# Patient Record
Sex: Female | Born: 1950 | Race: White | Hispanic: No | Marital: Married | Smoking: Former smoker
Health system: Southern US, Community
[De-identification: ages and names within clinical notes are randomized; demographics above are authoritative.]

## PROBLEM LIST (undated history)

## (undated) DIAGNOSIS — C9000 Multiple myeloma not having achieved remission: Secondary | ICD-10-CM

## (undated) DIAGNOSIS — G510 Bell's palsy: Secondary | ICD-10-CM

## (undated) HISTORY — PX: TOTAL SHOULDER REPLACEMENT: SUR1217

## (undated) HISTORY — PX: BUNIONECTOMY: SHX129

## (undated) HISTORY — PX: FOOT SURGERY: SHX648

## (undated) HISTORY — PX: REPLACEMENT TOTAL KNEE BILATERAL: SUR1225

## (undated) HISTORY — PX: SHOULDER SURGERY: SHX246

## (undated) HISTORY — PX: CHOLECYSTECTOMY: SHX55

---

## 2008-03-07 ENCOUNTER — Emergency Department (HOSPITAL_BASED_OUTPATIENT_CLINIC_OR_DEPARTMENT_OTHER): Admission: EM | Admit: 2008-03-07 | Discharge: 2008-03-07 | Payer: Self-pay | Admitting: Emergency Medicine

## 2008-03-18 ENCOUNTER — Encounter: Admission: RE | Admit: 2008-03-18 | Discharge: 2008-03-18 | Payer: Self-pay | Admitting: Orthopedic Surgery

## 2008-03-21 ENCOUNTER — Encounter: Admission: RE | Admit: 2008-03-21 | Discharge: 2008-03-21 | Payer: Self-pay | Admitting: Orthopedic Surgery

## 2008-03-25 ENCOUNTER — Ambulatory Visit (HOSPITAL_COMMUNITY): Admission: RE | Admit: 2008-03-25 | Discharge: 2008-03-25 | Payer: Self-pay | Admitting: Interventional Radiology

## 2008-03-27 ENCOUNTER — Ambulatory Visit (HOSPITAL_COMMUNITY): Admission: RE | Admit: 2008-03-27 | Discharge: 2008-03-27 | Payer: Self-pay | Admitting: Interventional Radiology

## 2008-04-15 ENCOUNTER — Encounter: Payer: Self-pay | Admitting: Interventional Radiology

## 2009-04-26 IMAGING — CT CT L SPINE W/O CM
4 of 6 series · 17 of 34 positions shown, 19 images · non-contrast
Comparison: MRI 03/18/2008

CLINICAL DATA: Low back pain.  Abnormal L2 vertebra.

CT LUMBAR SPINE WITHOUT CONTRAST
TECHNIQUE: Multidetector CT imaging of the lumbar spine was
performed without intravenous contrast administration. Multiplanar
CT image reconstructions were also generated.

[Series 2: l-spine helical · axial · 0.37mm/px · z∈[-358,-201]mm · 5 of 95 slices shown, 7 images]
[im 16/95  soft-tissue]
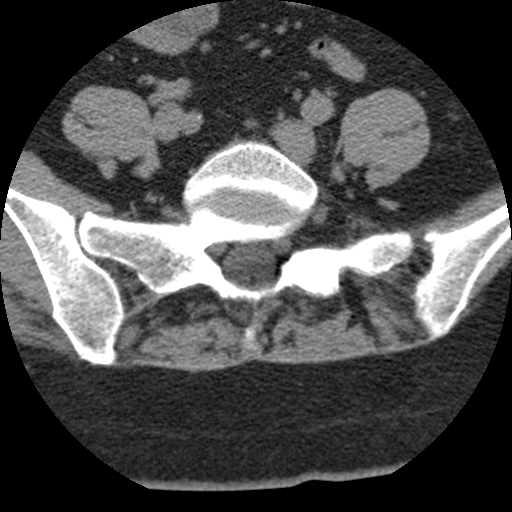
[im 16/95  bone]
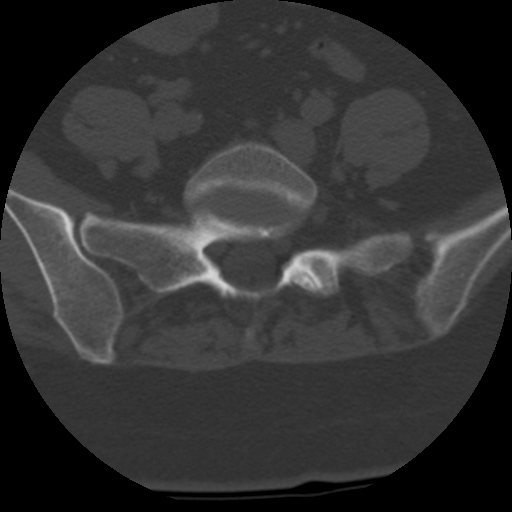
[im 32/95  bone]
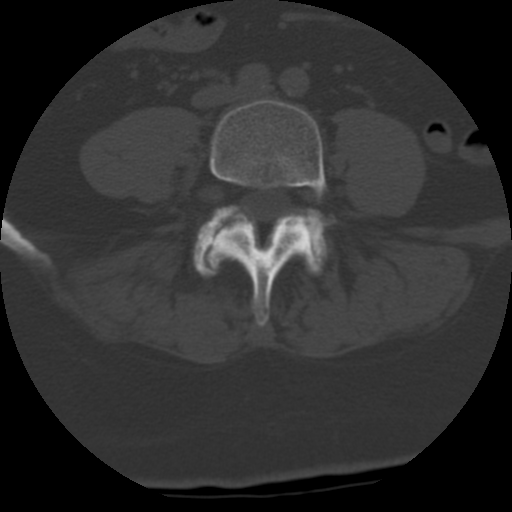
[im 48/95  bone]
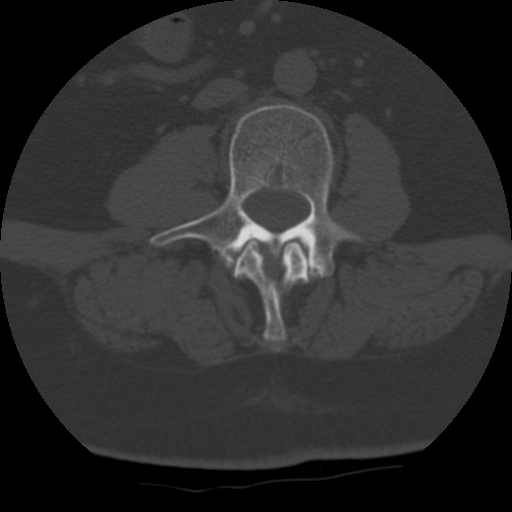
[im 63/95  bone]
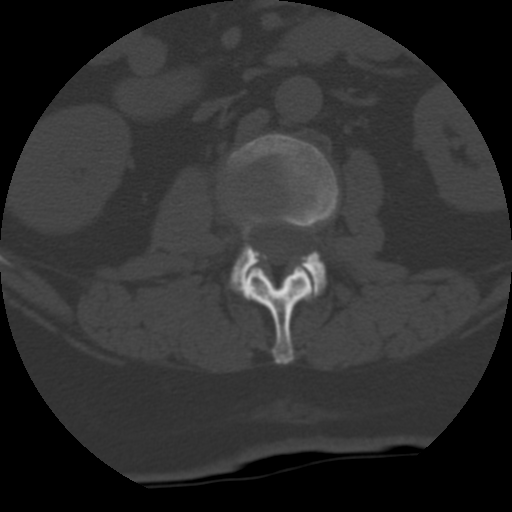
[im 79/95  soft-tissue]
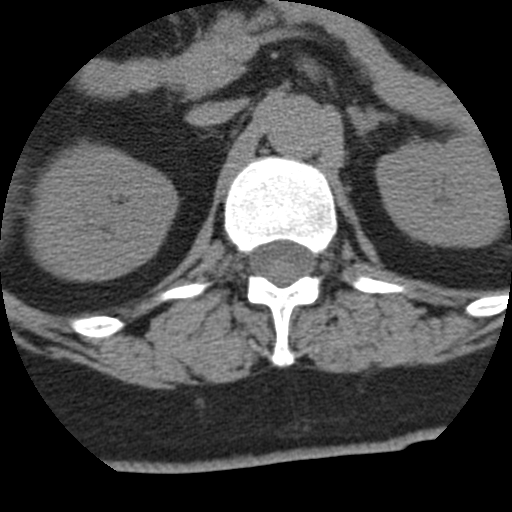
[im 79/95  bone]
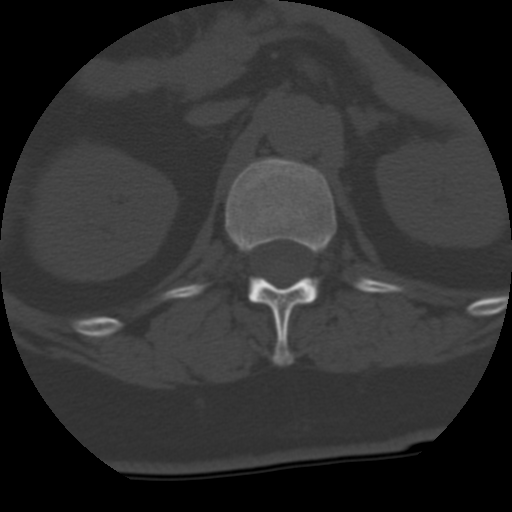

[Series 3: recon 2: l-spine helical · axial · 0.37mm/px · z∈[-358,-201]mm · 5 of 95 slices shown]
[im 16/95  bone]
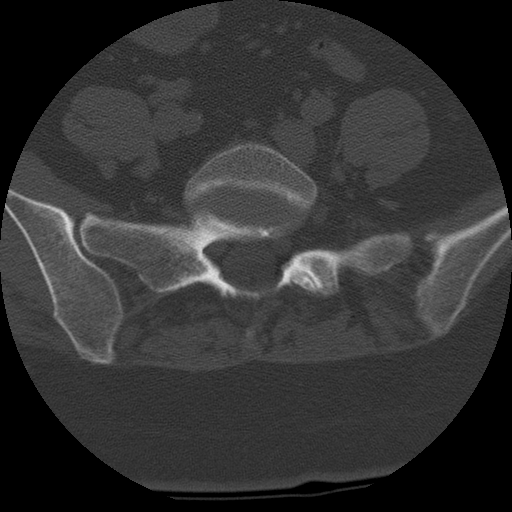
[im 32/95  bone]
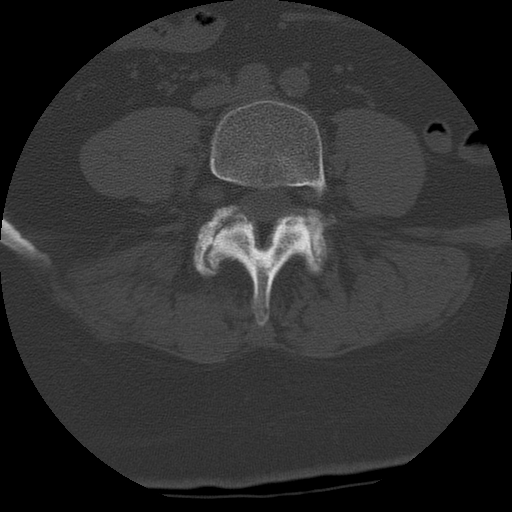
[im 48/95  bone]
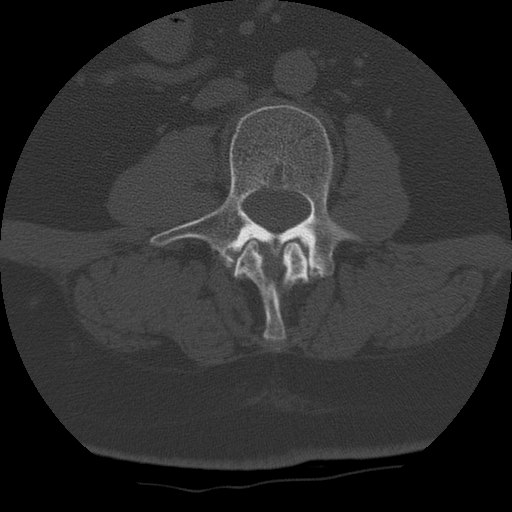
[im 63/95  bone]
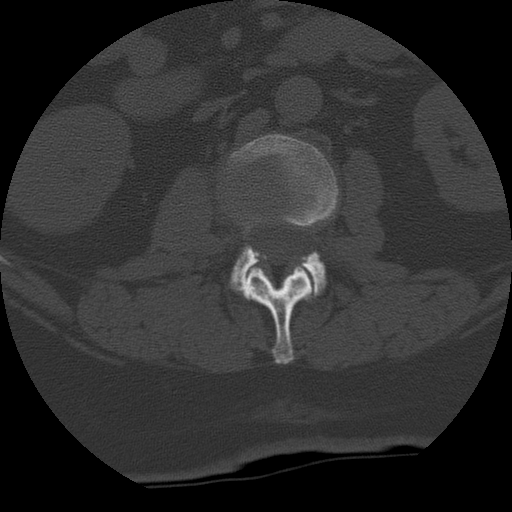
[im 79/95  bone]
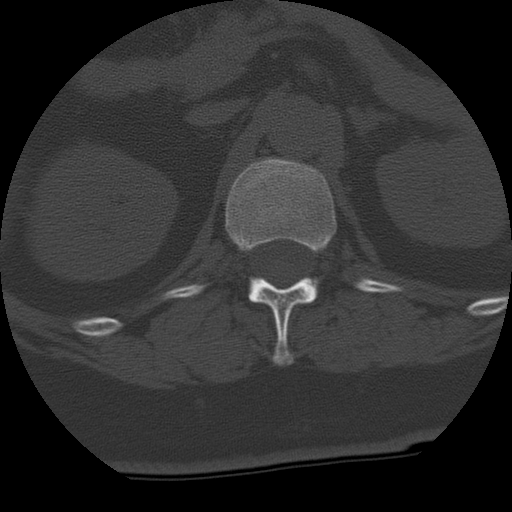

[Series 103: sagittal detail · sagittal · 0.47mm/px · 6 of 38 slices shown]
[im 4/38  soft-tissue]
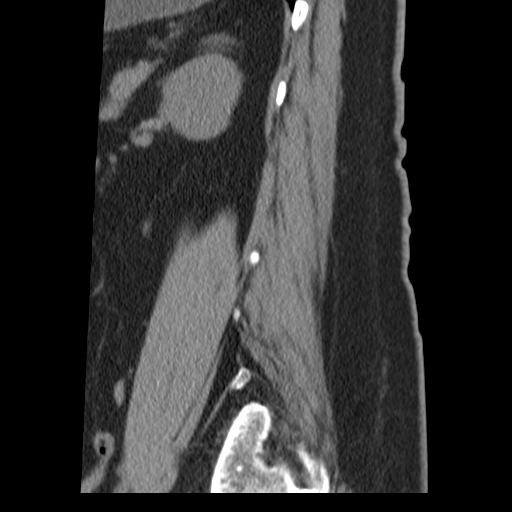
[im 7/38  bone]
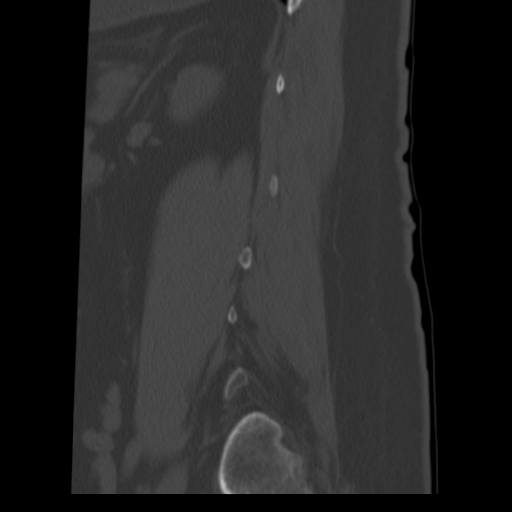
[im 13/38  bone]
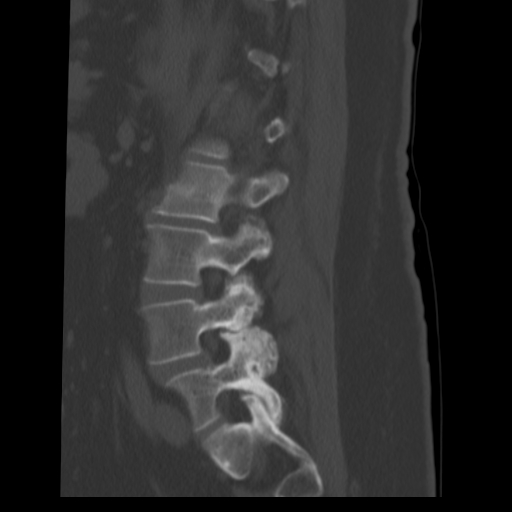
[im 19/38  bone]
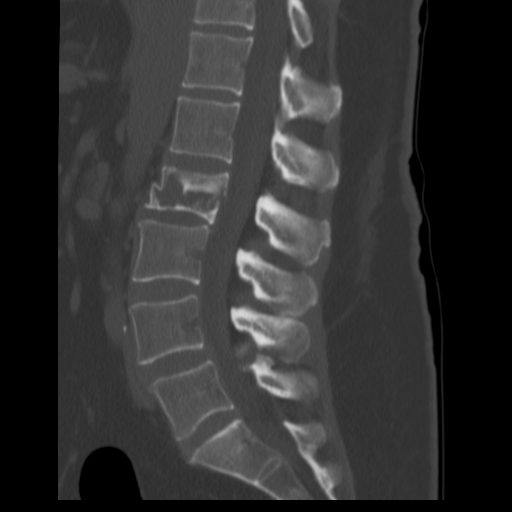
[im 25/38  bone]
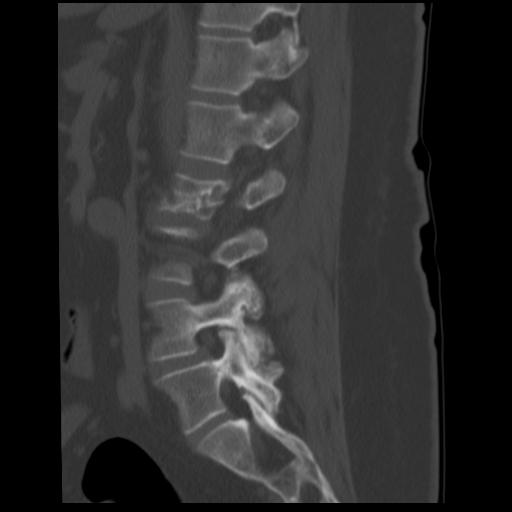
[im 31/38  bone]
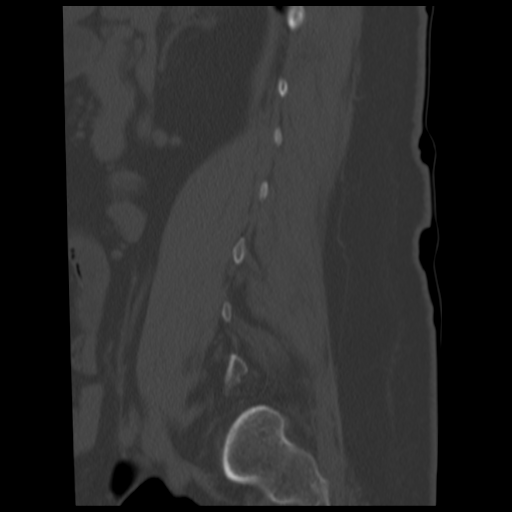

[Series 104: coronal detail · coronal · 0.47mm/px · 1 of 39 slices shown]
[im 20/39  bone]
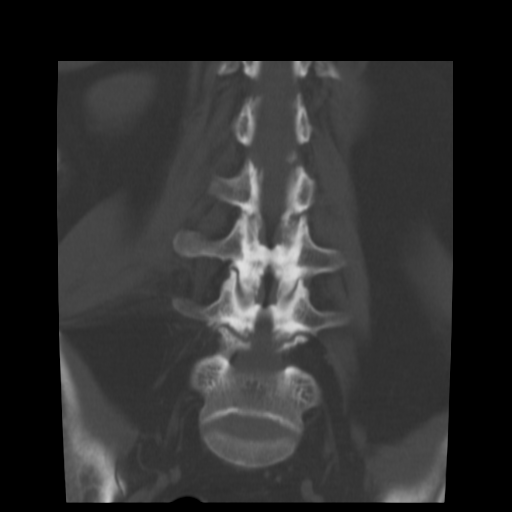

[17 of 34 positions shown; findings below may reference images not displayed]

FINDINGS: There is a pathologic fracture of the L2 vertebral body.
This is worsened somewhat over the last week.  There is loss of
height on the right side of 50%.  There are areas of lytic change
throughout the vertebral body, more pronounced towards the right.
The inferior end plate is destroyed.  There is some soft tissue
density material surrounding the vertebral body, including mild
encroachment upon the ventral epidural space.  The appearance is
not suggestive of an a hemangioma, and myeloma or metastatic
disease is the primary concern.

Elsewhere, no second lesions are seen.  There is a mild disc bulge
and mild facet degeneration at L3-4 without stenosis.  At L4-5,
there is a moderate disc bulge and moderate facet degeneration and
hypertrophy.  There is mild multifactorial stenosis at this level
without definite neural compression.  At L5, S1, there is facet
arthropathy but no slippage or stenosis.
IMPRESSION: Worsening of a pathologic compression fracture at L2, now with loss
of height on the right of 50%.  There is a lytic lesion of the
vertebral body that does not look like a hemangioma.  The primary
consideration is myeloma or metastatic disease.  There is some
extraosseous extension surrounding the vertebral body, including
some extension into the ventral epidural space more towards the
right.

Mild multifactorial stenosis at L4-5 because of degenerative
disease.

## 2009-04-28 IMAGING — XA IR KYPHOPLASTY LUMBAR INIT
1 series · 12 of 24 positions shown · non-contrast
Comparison: none

CLINICAL HISTORY: Progressive low back pain secondary to
compression fracture at L2.

[Series 1: run · 12 of 99 slices shown]
[im 5/99]
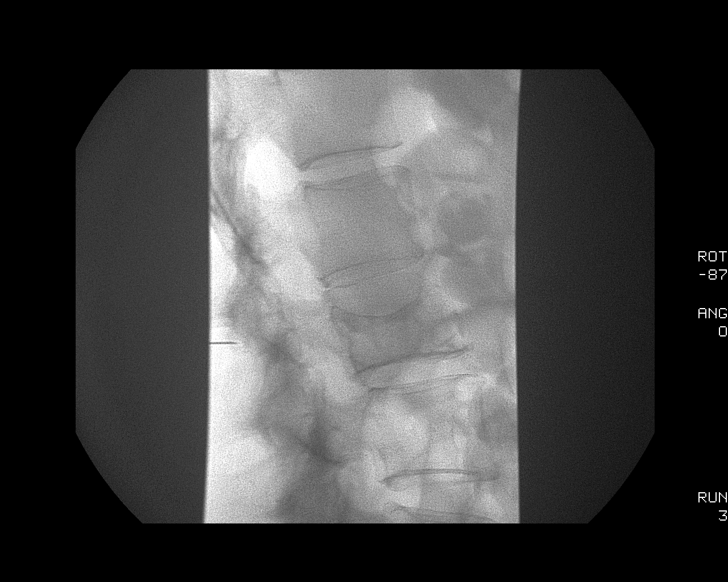
[im 13/99]
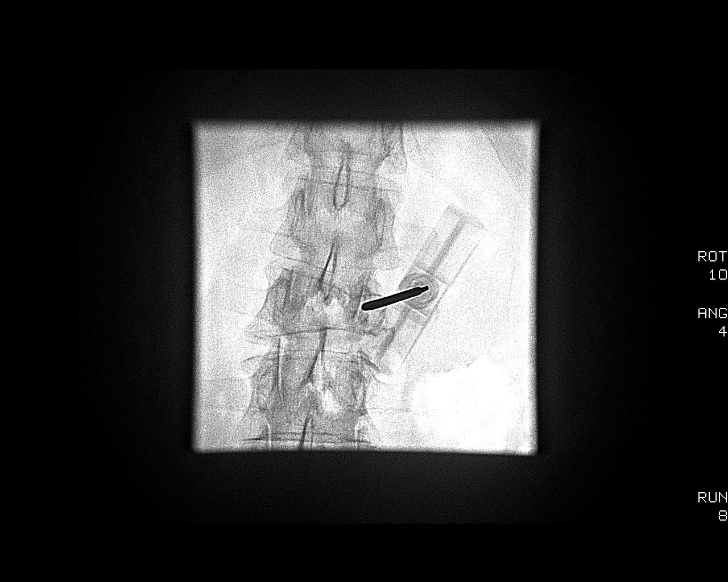
[im 22/99]
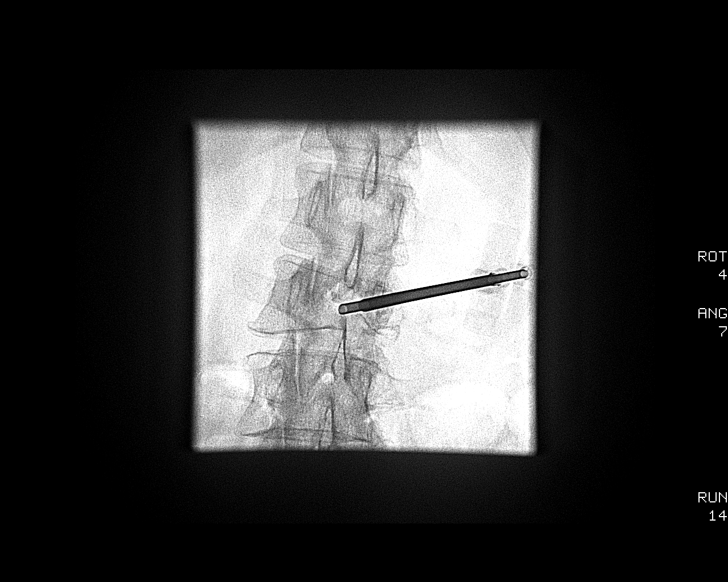
[im 30/99]
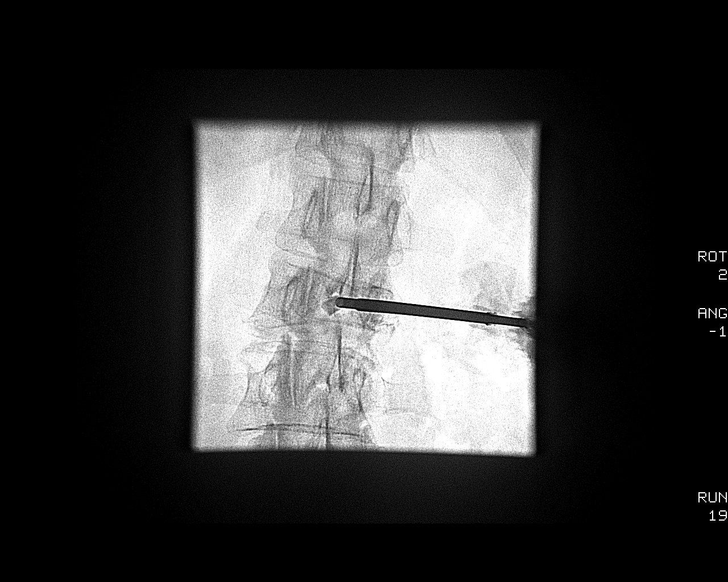
[im 39/99]
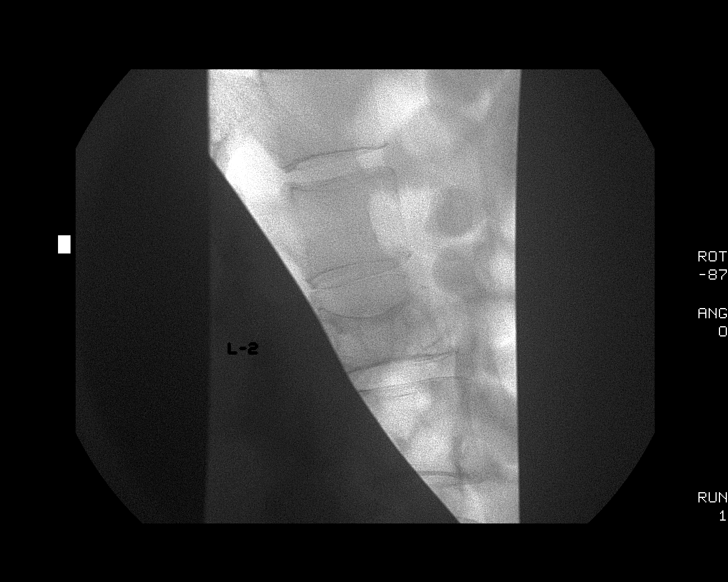
[im 47/99]
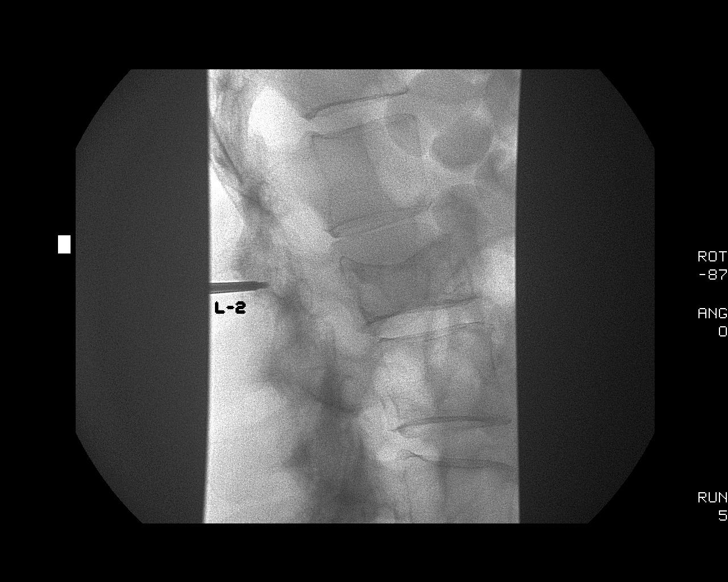
[im 56/99]
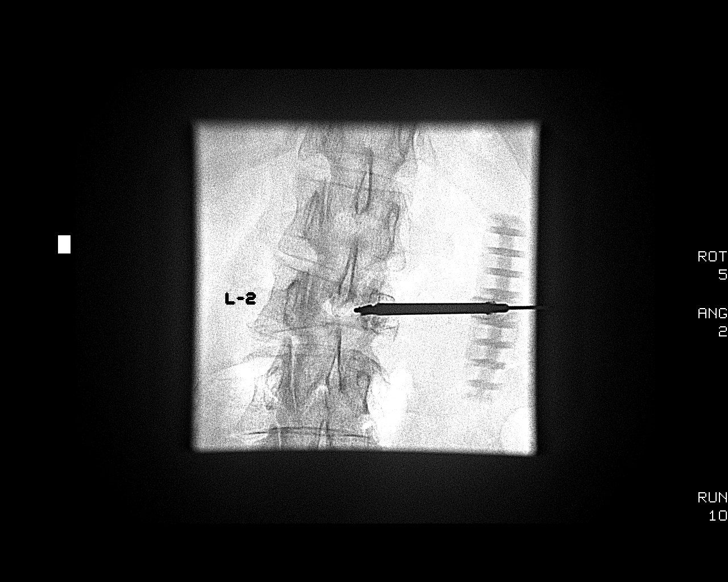
[im 64/99]
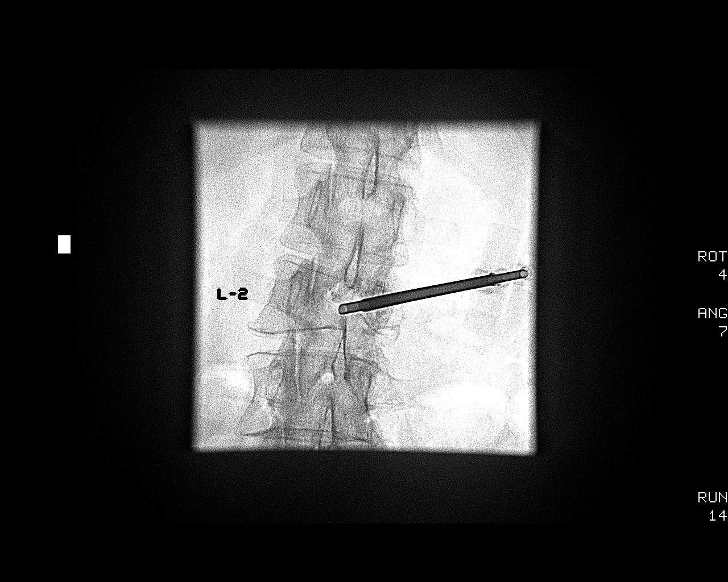
[im 73/99]
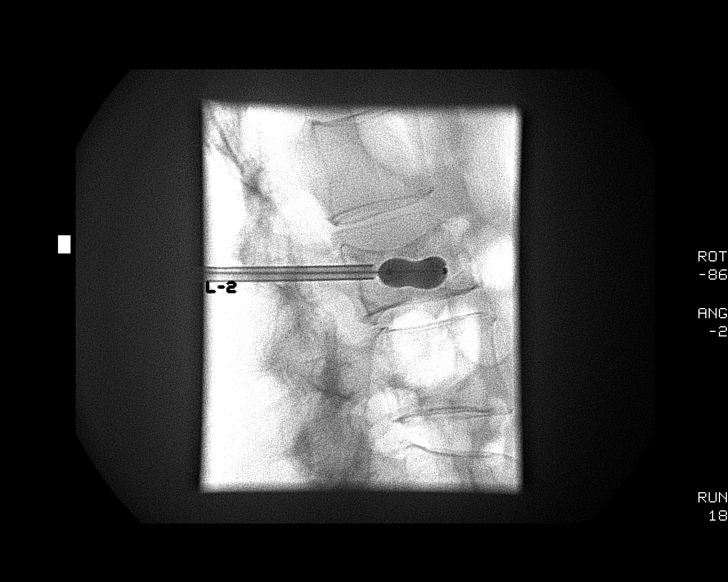
[im 81/99]
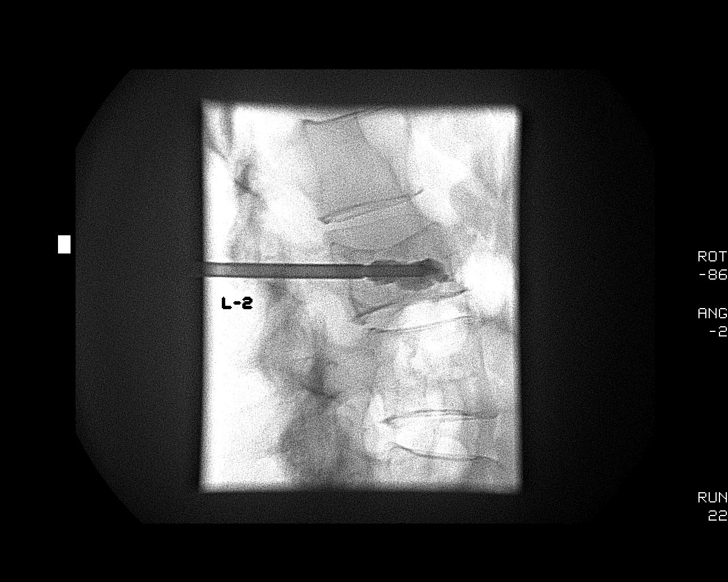
[im 90/99]
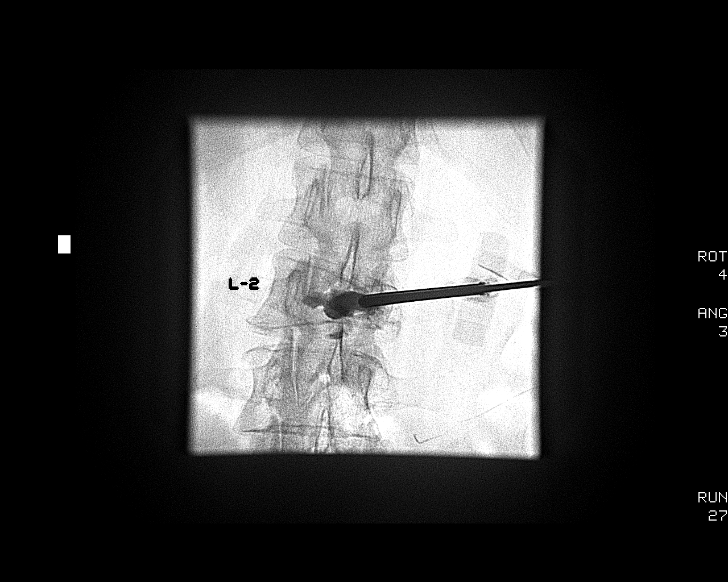
[im 99/99]
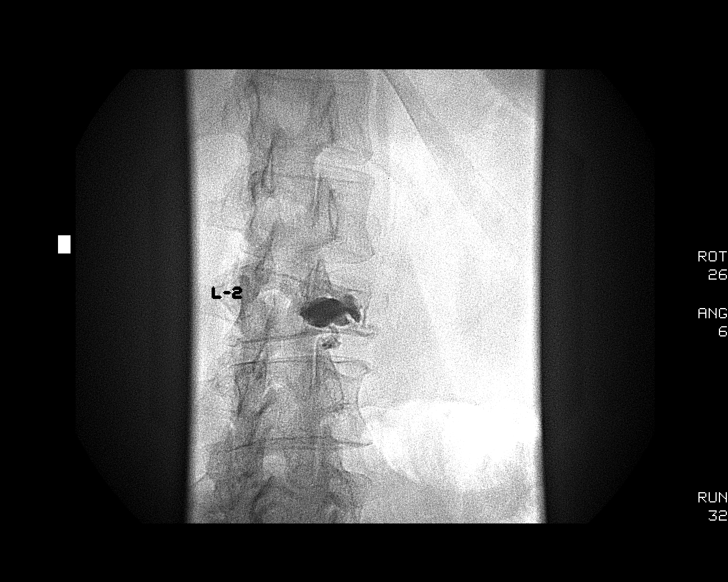

[12 of 24 positions shown; findings below may reference images not displayed]

BALLOON KYPHOPLASTY AT L2 PRECEDED BY CORE BIOPSY

Following a full explanation of the procedure along with
potentially associated complications, an informed witnessed consent
was obtained.

The patient was placed prone on the fluoroscopic table.  The skin
overlying the lumbar region was then prepped and draped in the
usual sterile fashion.

The skin overlying the right pedicle at L2 was then infiltrated
with 0.25% bupivacaine anterior to the pedicle itself.

Using biplane intermittent fluoroscopy, an 11-gauge Jamshidi needle
was then advanced through the right pedicle into the posterior one-
third at L2.  This was then exchanged out for a Kyphon advanced
osteo introducer system comprised of a working cannula and a Kyphon
osteo drill over a Kyphon osteo bone pin.  This combination was
advanced until the tip of the Kyphon osteo drill was in the
posterior third at L2.  At this time the bone pin was removed.

In a medial trajectory, the combination was advanced until the
working cannula was in the posterior third at L2.  The Kyphon osteo
drill was removed and a core sample sent for pathologic analysis.

A Kyphon bone biopsy device was then advanced through the working
cannula into the anterior one-third at L2.  A core sample from this
was also sent for pathologic analysis.  Through the working
cannula, a Kyphon inflatable bone tamp 20 x 3 was then advanced and
positioned with the distal marker 5 mm from the anterior aspect of
L2.  This was then inflated using contrast via a Kyphon inflation
syringe device via microtubing.  The inflation was continued until
there was apposition with the inferior endplate.

At this time methylmethacrylate mixture was reconstituted with
Tobramycin in the Kyphon bone mixing device system and gun.  The
gun was then connected via microtubing to the bone filler.  This
was then advanced into the anterior one-third at L2.  A gentle
instillation of methylmethacrylate mixture was then performed using
biplane intermittent fluoroscopy until there was excellent filling
in the AP and lateral projections.  Minimal extrusion of the
methylmethacrylate mixture was noted along the fractured cleft of
the inferior endplate.  No extension was noted into the epidural
venous structures or posteriorly into the spinal canal.

The patient tolerated the procedure well.  There were no acute
complications.

Medications utilized: Versed 3.5 mg.  Fentanyl 125 mcg.

Impression
1.  Status post fluoroscopic-guided needle placement for deep core
bone biopsy at L2.
2.  Status post vertebral body augmentation for painful metastatic
compression fracture at L2 using the balloon kyphoplasty technique.

## 2010-10-05 ENCOUNTER — Encounter: Payer: Self-pay | Admitting: Orthopedic Surgery

## 2011-01-26 NOTE — Consult Note (Signed)
Jordan Bruce, Jordan Bruce                 ACCOUNT NO.:  [id number]   MEDICAL RECORD NO.:  [id number]          PATIENT TYPE:  OUT   LOCATION:  [facility]S                         FACILITY:  [facility]   PHYSICIAN:  Sanjeev K. Deveshwar, M.D.DATE OF BIRTH:  1951-07-31   DATE OF CONSULTATION:  03/25/2008  DATE OF DISCHARGE:                                 CONSULTATION   CHIEF COMPLAINT:  L2 compression fracture.   HISTORY OF PRESENT ILLNESS:  This is a very pleasant 60 year old female  who was referred to Dr. Corliss Skains through the courtesy of Dr. Chaney Malling.  The patient fell down some stairs at home approximately for 4-5 weeks  ago, injuring her back, she was seen in consultation by Dr. Deanne Coffer, on  March 21, 2008.  An MRI had been performed on March 18, 2008, that showed an  acute to subacute fracture at L2, there was a 25% loss of height.  There  was also question of a hemangioma versus myeloma versus malignancy.  The  patient also had mild spinal stenosis at L4-L5.  The patient was to be  set up for a kyphoplasty or a vertebroplasty with Dr. Bonnielee Haff, however, Dr.  Bonnielee Haff felt that due to the difficulty of the procedure and the severity  of the fracture.  The patient was referred to Dr. Corliss Skains.  Dr.  Corliss Skains has agreed to meet with the patient and her family in  consultation.  The patient presents today accompanied by her husband and  daughter as well as her son for this consultation.   The patient reports that her pain has become progressively worse.  She  has been taking Vicodin 5/325 2 tablets q.4 h., sometimes 3 tablets q.4  h. without any significant relief of pain.  She is very uncomfortable  even sitting.  She does not sleep well at night.  Her appetite is  decreased.  She borrowed a walker in order to try to assist with her  ambulation, prior to this, the patient had been very active.  She feels  the pain is getting worse by the day.  Her family confirms this history.   PAST MEDICAL HISTORY:   Significant for hyperlipidemia.  She had a  previous negative colonoscopy.  She is otherwise been healthy.   SURGICAL HISTORY:  The patient has had a bunionectomy, cholecystectomy,  hysterectomy, and surgery for plantar fasciitis.  She reports nausea and  vomiting with anesthesia.   ALLERGIES:  She is allergic to PENICILLIN which causes hives.  She  denies allergies to shrimp, iodine, latex, shellfish, or contrast dye.   CURRENT MEDICATIONS:  1. Simvastatin.  2. Vicodin 5/325 1-2 q.4 h. p.r.n. as noted she is taking this pretty      much around-the-clock.  3. She is also taking methocarbamol 500 mg 1 q.6 h. for spasm.   SOCIAL HISTORY:  The patient is married.  She has 2 children.  Her  husband has 3 children.  The patient and her husband live in [place].  She continues to smoke one-pack cigarettes per day and has done so for  at least 30  years.  She does not drink alcohol.  She is a housewife.   FAMILY HISTORY:  Her mother is alive and well at age 64.  Her father is  alive at 63.  He has early dementia.  She has 1 brother who is alive and  well.   IMPRESSION AND PLAN:  As noted, the patient presents today for further  evaluation and to discuss treatment options of an L2 compression  fracture.  Dr. Corliss Skains did review the results of the MRI with the  patient and her family.  The kyphoplasty and vertebroplasty procedures  were described in detail along with the risks and benefits as well as  other potential treatment options such as continued immobility and pain  medication therapy.  The patient states that she cannot stand the pain  any longer, and she does not feel that the Vicodin is helping.  We did  give her a prescription for Percocet to be taken 1-2 tablets q.4-6 h.  p.r.n., #20, no refills.  The patient and her family would like to  proceed with the kyphoplasty which is what Dr. Corliss Skains has  recommended.  We have tentatively scheduled her for this coming   Wednesday.   Dr. Corliss Skains also discussed the fact that there was an abnormality on  the MRI that could be consistent with hemangioma, myeloma,  or  malignancy.  A biopsy will be performed at the time of the procedure.  The patient has no history of osteoporosis.  She has never had a bone  density study performed.  We told her to follow up with her primary care  physician in order to schedule this study.  All of the patient's and her  family's questions were answered.  The risk of the procedure were  thoroughly reviewed.  Greater than 40 minutes was spent on this consult.  They were shown some video animations of the procedure as well as given  some written materials to study at home.  There were told to call if  they had any questions.      Delton See, P.A.    ______________________________  Grandville Silos. Corliss Skains, M.D.    DR/MEDQ  D:  03/25/2008  T:  03/26/2008  Job:  [id number]   cc:   Thereasa Distance A. Chaney Malling, M.D.  Cheryl Flash, PA-C

## 2011-06-11 LAB — CBC
Hemoglobin: 16.2 — ABNORMAL HIGH
MCHC: 34.3
MCV: 95
RBC: 4.96
WBC: 10.5

## 2011-06-11 LAB — PROTIME-INR: INR: 0.9

## 2011-06-11 LAB — BASIC METABOLIC PANEL
BUN: 14
Chloride: 99
GFR calc non Af Amer: 56 — ABNORMAL LOW
Potassium: 3.7
Sodium: 135

## 2016-11-19 ENCOUNTER — Emergency Department (HOSPITAL_BASED_OUTPATIENT_CLINIC_OR_DEPARTMENT_OTHER): Payer: 59

## 2016-11-19 ENCOUNTER — Encounter (HOSPITAL_BASED_OUTPATIENT_CLINIC_OR_DEPARTMENT_OTHER): Payer: Self-pay

## 2016-11-19 ENCOUNTER — Emergency Department (HOSPITAL_BASED_OUTPATIENT_CLINIC_OR_DEPARTMENT_OTHER)
Admission: EM | Admit: 2016-11-19 | Discharge: 2016-11-19 | Disposition: A | Discharge status: Home / Self Care | Payer: 59 | Attending: Emergency Medicine | Admitting: Emergency Medicine

## 2016-11-19 DIAGNOSIS — R29810 Facial weakness: Secondary | ICD-10-CM | POA: Diagnosis present

## 2016-11-19 DIAGNOSIS — Z87891 Personal history of nicotine dependence: Secondary | ICD-10-CM | POA: Diagnosis not present

## 2016-11-19 DIAGNOSIS — Z79899 Other long term (current) drug therapy: Secondary | ICD-10-CM | POA: Insufficient documentation

## 2016-11-19 DIAGNOSIS — G510 Bell's palsy: Secondary | ICD-10-CM | POA: Insufficient documentation

## 2016-11-19 HISTORY — DX: Multiple myeloma not having achieved remission: C90.00

## 2016-11-19 LAB — BASIC METABOLIC PANEL
ANION GAP: 9 (ref 5–15)
BUN: 15 mg/dL (ref 6–20)
CO2: 29 mmol/L (ref 22–32)
Calcium: 9.5 mg/dL (ref 8.9–10.3)
Chloride: 101 mmol/L (ref 101–111)
Creatinine, Ser: 1.2 mg/dL — ABNORMAL HIGH (ref 0.44–1.00)
GFR calc Af Amer: 55 mL/min — ABNORMAL LOW (ref >60)
GFR, EST NON AFRICAN AMERICAN: 47 mL/min — AB (ref >60)
GLUCOSE: 132 mg/dL — AB (ref 65–99)
POTASSIUM: 4.1 mmol/L (ref 3.5–5.1)
Sodium: 139 mmol/L (ref 135–145)

## 2016-11-19 LAB — CBC WITH DIFFERENTIAL/PLATELET
Basophils Absolute: 0.1 10*3/uL (ref 0.0–0.1)
Basophils Relative: 1 %
Eosinophils Absolute: 0.4 10*3/uL (ref 0.0–0.7)
Eosinophils Relative: 6 %
HEMATOCRIT: 41.2 % (ref 36.0–46.0)
Hemoglobin: 14.2 g/dL (ref 12.0–15.0)
LYMPHS ABS: 1.9 10*3/uL (ref 0.7–4.0)
LYMPHS PCT: 31 %
MCH: 32.5 pg (ref 26.0–34.0)
MCHC: 34.5 g/dL (ref 30.0–36.0)
MCV: 94.3 fL (ref 78.0–100.0)
Monocytes Absolute: 0.6 10*3/uL (ref 0.1–1.0)
Monocytes Relative: 10 %
NEUTROS PCT: 52 %
Neutro Abs: 3.2 10*3/uL (ref 1.7–7.7)
PLATELETS: 192 10*3/uL (ref 150–400)
RBC: 4.37 MIL/uL (ref 3.87–5.11)
RDW: 12.7 % (ref 11.5–15.5)
WBC: 6.1 10*3/uL (ref 4.0–10.5)

## 2016-11-19 LAB — CBG MONITORING, ED: Glucose-Capillary: 131 mg/dL — ABNORMAL HIGH (ref 65–99)

## 2016-11-19 MED ORDER — VALACYCLOVIR HCL 500 MG PO TABS
1000.0000 mg | ORAL_TABLET | Freq: Once | ORAL | Status: AC
  Administered 2016-11-19: 1000 mg via ORAL
  Filled 2016-11-19: qty 2

## 2016-11-19 MED ORDER — VALACYCLOVIR HCL 1 G PO TABS: 1000.0000 mg | ORAL_TABLET | Freq: Three times a day (TID) | ORAL | 0 refills | Status: AC

## 2016-11-19 MED ORDER — METHYLPREDNISOLONE 4 MG PO TBPK: ORAL_TABLET | ORAL | 0 refills | Status: AC

## 2016-11-19 MED ORDER — METHYLPREDNISOLONE SODIUM SUCC 125 MG IJ SOLR
125.0000 mg | Freq: Once | INTRAMUSCULAR | Status: AC
  Administered 2016-11-19: 125 mg via INTRAVENOUS
  Filled 2016-11-19: qty 2

## 2016-11-19 MED ORDER — ARTIFICIAL TEARS OP OINT: TOPICAL_OINTMENT | Freq: Every evening | OPHTHALMIC | 0 refills | Status: AC | PRN

## 2016-11-19 NOTE — ED Triage Notes (Addendum)
Pt immediately taken to rm 4. Pt reports having drooping to R eye that started at 12:00 today and at 16:00 pt noticed trouble speaking and R sided facial droop. Pt has noted slurred speech. Dr. Jeneen Rinks notified

## 2016-11-19 NOTE — ED Provider Notes (Signed)
[facility] Provider Note   CSN: [id number] Arrival date & time: 11/19/16  1817  By signing my name below, I, [name], attest that this documentation has been prepared under the direction and in the presence of Tanna Furry, MD. Electronically Signed: [name], ED Scribe. 11/19/16. 7:13 PM.   History   Chief Complaint Chief Complaint  Patient presents with  . Facial Droop   The history is provided by the patient. No language interpreter was used.   HPI Comments: Jordan Bruce is a 66 y.o. female who presents to the Emergency Department complaining of gradual onset, constant, moderate right eye weakness beginning at 1 PM. Pt notes associated right watering, and speech difficulty. Per pt, symptoms began at 1 PM when she noticed increased right eye watering. At 5 PM tonight, she began noticing speech difficulty and her son noticed she had a slight facial droop around her right eye. Pt is on multiple regular medications. Denies PMHx of TIA, CAD, DM or HTN. No Hx of smoking. 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 Pt normally ambulates with a walker at home. Pt denies headache, ear pain, ear ringing, difficulty walking.  Past Medical History:  Diagnosis Date  . Multiple myeloma (HCC)     There are no active problems to display for this patient.   Past Surgical History:  Procedure Laterality Date  . BUNIONECTOMY Bilateral   . CHOLECYSTECTOMY    . FOOT SURGERY    . REPLACEMENT TOTAL KNEE BILATERAL    . SHOULDER SURGERY Left   . TOTAL SHOULDER REPLACEMENT Right     OB History    No data available       Home Medications    Prior to Admission medications   Medication Sig Start Date End Date Taking? Authorizing Provider  cyclobenzaprine (FLEXERIL) 10 MG tablet Take 10 mg by mouth 3 (three) times daily as needed for muscle spasms.   Yes Historical Provider, MD  donepezil (ARICEPT) 10 MG tablet Take 10 mg by  mouth at bedtime.   Yes Historical Provider, MD  DULoxetine (CYMBALTA) 30 MG capsule Take 30 mg by mouth daily.   Yes Historical Provider, MD  folic acid (FOLVITE) 1 MG tablet Take 1 mg by mouth daily.   Yes Historical Provider, MD  lidocaine (LIDODERM) 5 % Place 1 patch onto the skin daily. Remove & Discard patch within 12 hours or as directed by MD   Yes Historical Provider, MD  meloxicam (MOBIC) 7.5 MG tablet Take 7.5 mg by mouth daily.   Yes Historical Provider, MD  memantine (NAMENDA) 5 MG tablet Take 5 mg by mouth 2 (two) times daily.   Yes Historical Provider, MD  morphine (MS CONTIN) 30 MG 12 hr tablet Take 30 mg by mouth every 12 (twelve) hours.   Yes Historical Provider, MD  oxyCODONE (ROXICODONE) 15 MG immediate release tablet Take 20 mg by mouth every 4 (four) hours as needed for pain.   Yes Historical Provider, MD  pregabalin (LYRICA) 150 MG capsule Take 150 mg by mouth 2 (two) times daily.   Yes Historical Provider, MD  artificial tears (LACRILUBE) O[place] ophthalmic ointment Place into both eyes at bedtime as needed for dry eyes. 11/19/16   Tanna Furry, MD  methylPREDNISolone (MEDROL DOSEPAK) 4 MG TBPK tablet 6 po on day 1, decrease by 1 tab per day 11/19/16   Tanna Furry, MD  valACYclovir (VALTREX) 1000 MG tablet Take 1 tablet (1,000 mg  total) by mouth 3 (three) times daily. 11/19/16 11/26/16  Tanna Furry, MD    Family History No family history on file.  Social History Social History  Substance Use Topics  . Smoking status: Former Smoker    Quit date: 11/20/2007  . Smokeless tobacco: Never Used  . Alcohol use No     Allergies   Penicillins and Sulfa antibiotics   Review of Systems Review of Systems  Constitutional: Negative for appetite change, chills, diaphoresis, fatigue and fever.  HENT: Negative for ear pain, mouth sores, sore throat and trouble swallowing.   Eyes: Negative for visual disturbance.       +Increased watering of right eye  Respiratory: Negative for cough, chest  tightness, shortness of breath and wheezing.   Cardiovascular: Negative for chest pain.  Gastrointestinal: Negative for abdominal distention, abdominal pain, diarrhea, nausea and vomiting.  Endocrine: Negative for polydipsia, polyphagia and polyuria.  Genitourinary: Negative for dysuria, frequency and hematuria.  Musculoskeletal: Negative for gait problem.  Skin: Negative for color change, pallor and rash.  Neurological: Positive for facial asymmetry and speech difficulty. Negative for dizziness, syncope, light-headedness and headaches.  Hematological: Does not bruise/bleed easily.  Psychiatric/Behavioral: Negative for behavioral problems and confusion.     Physical Exam Updated Vital Signs BP 141/79 (BP Location: Right Arm)   Pulse 68   Temp 99.7 F (37.6 C) (Oral)   Resp 16   Ht 5' 5"  (1.651 m)   Wt 203 lb (92.1 kg)   SpO2 99%   BMI 33.78 kg/m   Physical Exam  Constitutional: She is oriented to person, place, and time. She appears well-developed and well-nourished. No distress.  HENT:  Head: Normocephalic.  Eyes: Conjunctivae are normal. Pupils are equal, round, and reactive to light. No scleral icterus.  Neck: Normal range of motion. Neck supple. No thyromegaly present.  Cardiovascular: Normal rate and regular rhythm.  Exam reveals no gallop and no friction rub.   No murmur heard. Pulmonary/Chest: Effort normal and breath sounds normal. No respiratory distress. She has no wheezes. She has no rales.  Abdominal: Soft. Bowel sounds are normal. She exhibits no distension. There is no tenderness. There is no rebound.  Musculoskeletal: Normal range of motion.  Neurological: She is alert and oriented to person, place, and time.  Weak right upper and lower face Flattened nasal fold on the right  Skin: Skin is warm and dry. No rash noted.  Psychiatric: She has a normal mood and affect. Her behavior is normal.     ED Treatments / Results  DIAGNOSTIC STUDIES: Oxygen Saturation  is 93% on RA, adequate by my interpretation.    COORDINATION OF CARE: 6:53 PM Discussed treatment plan with pt at bedside which includes CT head and Solu-medrol and pt agreed to plan.  Labs (all labs ordered are listed, but only abnormal results are displayed) Labs Reviewed  BASIC METABOLIC PANEL - Abnormal; Notable for the following:       Result Value   Glucose, Bld 132 (*)    Creatinine, Ser 1.20 (*)    GFR calc non Af Amer 47 (*)    GFR calc Af Amer 55 (*)    All other components within normal limits  CBG MONITORING, ED - Abnormal; Notable for the following:    Glucose-Capillary 131 (*)    All other components within normal limits  CBC WITH DIFFERENTIAL/PLATELET    EKG  EKG Interpretation None       Radiology Ct Head Wo Contrast  Result  Date: 11/19/2016 CLINICAL DATA:  Drooping right eye and trouble speaking with right-sided facial droop EXAM: CT HEAD WITHOUT CONTRAST TECHNIQUE: Contiguous axial images were obtained from the base of the skull through the vertex without intravenous contrast. COMPARISON:  None. FINDINGS: Brain: No evidence of acute infarction, hemorrhage, hydrocephalus, extra-axial collection or mass lesion/mass effect. Vascular: No hyperdense vessel or unexpected calcification. Skull: Normal. Negative for fracture or focal lesion. Sinuses/Orbits: No acute finding. Other: None IMPRESSION: No CT evidence for acute intracranial abnormality. MRI follow-up as clinically indicated. Electronically Signed   By: Donavan Foil M.D.   On: 11/19/2016 19:22    Procedures Procedures (including critical care time)  Medications Ordered in ED Medications  methylPREDNISolone sodium succinate (SOLU-MEDROL) 125 mg/2 mL injection 125 mg (125 mg Intravenous Given 11/19/16 1920)  valACYclovir (VALTREX) tablet 1,000 mg (1,000 mg Oral Given 11/19/16 1919)     Initial Impression / Assessment and Plan / ED Course  I have reviewed the triage vital signs and the nursing  notes.  Pertinent labs & imaging results that were available during my care of the patient were reviewed by me and considered in my medical decision making (see chart for details).    I personally performed the services described in this documentation, which was scribed in my presence. The recorded information has been reviewed and is accurate.   Final Clinical Impressions(s) / ED Diagnoses   Final diagnoses:  Bell's palsy    Patient with obvious right upper, and lower facial weakness in face of otherwise normal neurological exam. Normal CT. Low risk patient. All findings consistent with Bell's palsy/acute peripheral seventh nerve palsy. Given IV site Medrol and by mouth Valtrex. She has a neurologist that she follows with, Dr. Margette Fast. I asked her to call Monday for a follow-up appointment. Given Lacri-Lube and eye patch to wear nightly. Valtrex and Medrol Dosepak.  New Prescriptions New Prescriptions   ARTIFICIAL TEARS (LACRILUBE) [place] into both eyes at bedtime as needed for dry eyes.   METHYLPREDNISOLONE (MEDROL DOSEPAK) 4 MG TBPK TABLET    6 po on day 1, decrease by 1 tab per day   VALACYCLOVIR (VALTREX) 1000 MG TABLET    Take 1 tablet (1,000 mg total) by mouth 3 (three) times daily.     Tanna Furry, MD 11/19/16 2044

## 2016-11-19 NOTE — Discharge Instructions (Signed)
Apply lacrilube to eye and cover closed eye with patch at night. All Dr. Jannifer Franklin on Monday to arrange follow-up appointment

## 2016-11-19 NOTE — ED Notes (Signed)
ED Provider at bedside. 

## 2016-11-23 ENCOUNTER — Telehealth: Payer: Self-pay | Admitting: Neurology

## 2016-11-23 NOTE — Telephone Encounter (Signed)
I called to schedule  Appointment for Jordan Bruce and spoke to her husband He said she already has appointment in [facility] and does not need this appointment dg

## 2016-11-23 NOTE — Telephone Encounter (Signed)
Noted  

## 2018-03-06 ENCOUNTER — Encounter (HOSPITAL_BASED_OUTPATIENT_CLINIC_OR_DEPARTMENT_OTHER): Payer: Self-pay

## 2018-03-06 ENCOUNTER — Emergency Department (HOSPITAL_BASED_OUTPATIENT_CLINIC_OR_DEPARTMENT_OTHER)
Admission: EM | Admit: 2018-03-06 | Discharge: 2018-03-06 | Disposition: A | Discharge status: Home / Self Care | Payer: 59 | Attending: Emergency Medicine | Admitting: Emergency Medicine

## 2018-03-06 ENCOUNTER — Other Ambulatory Visit: Payer: Self-pay

## 2018-03-06 DIAGNOSIS — Z96611 Presence of right artificial shoulder joint: Secondary | ICD-10-CM | POA: Insufficient documentation

## 2018-03-06 DIAGNOSIS — Z87891 Personal history of nicotine dependence: Secondary | ICD-10-CM | POA: Insufficient documentation

## 2018-03-06 DIAGNOSIS — Z96653 Presence of artificial knee joint, bilateral: Secondary | ICD-10-CM | POA: Insufficient documentation

## 2018-03-06 DIAGNOSIS — Z79899 Other long term (current) drug therapy: Secondary | ICD-10-CM | POA: Insufficient documentation

## 2018-03-06 DIAGNOSIS — R112 Nausea with vomiting, unspecified: Secondary | ICD-10-CM | POA: Diagnosis not present

## 2018-03-06 DIAGNOSIS — R197 Diarrhea, unspecified: Secondary | ICD-10-CM | POA: Insufficient documentation

## 2018-03-06 HISTORY — DX: Bell's palsy: G51.0

## 2018-03-06 LAB — CBC WITH DIFFERENTIAL/PLATELET
Basophils Absolute: 0 10*3/uL (ref 0.0–0.1)
Basophils Relative: 1 %
EOS ABS: 0 10*3/uL (ref 0.0–0.7)
Eosinophils Relative: 0 %
HCT: 41.6 % (ref 36.0–46.0)
Hemoglobin: 15.1 g/dL — ABNORMAL HIGH (ref 12.0–15.0)
LYMPHS PCT: 19 %
Lymphs Abs: 1 10*3/uL (ref 0.7–4.0)
MCH: 33 pg (ref 26.0–34.0)
MCHC: 36.3 g/dL — AB (ref 30.0–36.0)
MCV: 90.8 fL (ref 78.0–100.0)
Monocytes Absolute: 0.2 10*3/uL (ref 0.1–1.0)
Monocytes Relative: 4 %
NEUTROS PCT: 76 %
Neutro Abs: 4.1 10*3/uL (ref 1.7–7.7)
PLATELETS: 157 10*3/uL (ref 150–400)
RBC: 4.58 MIL/uL (ref 3.87–5.11)
RDW: 14 % (ref 11.5–15.5)
WBC: 5.3 10*3/uL (ref 4.0–10.5)

## 2018-03-06 LAB — COMPREHENSIVE METABOLIC PANEL
ALT: 19 U/L (ref 14–54)
ANION GAP: 11 (ref 5–15)
AST: 33 U/L (ref 15–41)
Albumin: 4.4 g/dL (ref 3.5–5.0)
Alkaline Phosphatase: 81 U/L (ref 38–126)
BUN: 15 mg/dL (ref 6–20)
CALCIUM: 9.6 mg/dL (ref 8.9–10.3)
CO2: 29 mmol/L (ref 22–32)
CREATININE: 1.05 mg/dL — AB (ref 0.44–1.00)
Chloride: 102 mmol/L (ref 101–111)
GFR, EST NON AFRICAN AMERICAN: 55 mL/min — AB (ref >60)
Glucose, Bld: 135 mg/dL — ABNORMAL HIGH (ref 65–99)
Potassium: 3.7 mmol/L (ref 3.5–5.1)
Sodium: 142 mmol/L (ref 135–145)
Total Bilirubin: 0.5 mg/dL (ref 0.3–1.2)
Total Protein: 7.8 g/dL (ref 6.5–8.1)

## 2018-03-06 LAB — LIPASE, BLOOD: LIPASE: 51 U/L (ref 11–51)

## 2018-03-06 MED ORDER — ONDANSETRON HCL 4 MG/2ML IJ SOLN
4.0000 mg | Freq: Once | INTRAMUSCULAR | Status: AC
  Administered 2018-03-06: 4 mg via INTRAVENOUS
  Filled 2018-03-06: qty 2

## 2018-03-06 MED ORDER — LOPERAMIDE HCL 2 MG PO CAPS: 2.0000 mg | ORAL_CAPSULE | Freq: Four times a day (QID) | ORAL | 0 refills | Status: AC | PRN

## 2018-03-06 MED ORDER — ONDANSETRON 4 MG PO TBDP: 4.0000 mg | ORAL_TABLET | ORAL | 0 refills | Status: DC | PRN

## 2018-03-06 MED ORDER — SODIUM CHLORIDE 0.9 % IV BOLUS (SEPSIS)
1000.0000 mL | Freq: Once | INTRAVENOUS | Status: AC
  Administered 2018-03-06: 1000 mL via INTRAVENOUS

## 2018-03-06 MED ORDER — SODIUM CHLORIDE 0.9 % IV SOLN: 1000.0000 mL | INTRAVENOUS | Status: DC

## 2018-03-06 NOTE — ED Provider Notes (Signed)
[facility] EMERGENCY DEPARTMENT Provider Note   CSN: [id number] Arrival date & time: 03/06/18  1843     History   Chief Complaint Chief Complaint  Patient presents with  . Emesis    HPI Jordan Bruce is a 67 y.o. female.  HPI Reports that she developed diarrhea 2 days ago.  She reports having multiple stools on the first 2 days.  Times up to every 15 minutes.  It is been approximately 3 hours since her last bowel movement.  She reports stool has been loose and watery and brown.  No blood.  She has had nausea and a couple of episodes of vomiting.  She reports cramping abdominal pain.  No documented fever.  She reports sometimes she is felt some chills.  Patient has no history of C. difficile.  Denies any recent antibiotics.  Patient's husband reports that he had diarrhea that is now resolved. Past Medical History:  Diagnosis Date  . Bell's palsy   . Multiple myeloma (HCC)     There are no active problems to display for this patient.   Past Surgical History:  Procedure Laterality Date  . BUNIONECTOMY Bilateral   . CHOLECYSTECTOMY    . FOOT SURGERY    . REPLACEMENT TOTAL KNEE BILATERAL    . SHOULDER SURGERY Left   . TOTAL SHOULDER REPLACEMENT Right      OB History   None      Home Medications    Prior to Admission medications   Medication Sig Start Date End Date Taking? Authorizing Provider  artificial tears (LACRILUBE) OINT ophthalmic ointment Place into both eyes at bedtime as needed for dry eyes. 11/19/16   Tanna Furry, MD  cyclobenzaprine (FLEXERIL) 10 MG tablet Take 10 mg by mouth 3 (three) times daily as needed for muscle spasms.    [provider]  donepezil (ARICEPT) 10 MG tablet Take 10 mg by mouth at bedtime.    [provider]  DULoxetine (CYMBALTA) 30 MG capsule Take 30 mg by mouth daily.    [provider]  folic acid (FOLVITE) 1 MG tablet Take 1 mg by mouth daily.    [provider]  lidocaine (LIDODERM) 5  % Place 1 patch onto the skin daily. Remove & Discard patch within 12 hours or as directed by MD    [provider]  loperamide (IMODIUM) 2 MG capsule Take 1 capsule (2 mg total) by mouth 4 (four) times daily as needed for diarrhea or loose stools. 03/06/18   Charlesetta Shanks, MD  meloxicam (MOBIC) 7.5 MG tablet Take 7.5 mg by mouth daily.    [provider]  memantine (NAMENDA) 5 MG tablet Take 5 mg by mouth 2 (two) times daily.    [provider]  methylPREDNISolone (MEDROL DOSEPAK) 4 MG TBPK tablet 6 po on day 1, decrease by 1 tab per day 11/19/16   Tanna Furry, MD  morphine (MS CONTIN) 30 MG 12 hr tablet Take 30 mg by mouth every 12 (twelve) hours.    [provider]  ondansetron (ZOFRAN ODT) 4 MG disintegrating tablet Take 1 tablet (4 mg total) by mouth every 4 (four) hours as needed for nausea or vomiting. 03/06/18   Charlesetta Shanks, MD  oxyCODONE (ROXICODONE) 15 MG immediate release tablet Take 20 mg by mouth every 4 (four) hours as needed for pain.    [provider]  pregabalin (LYRICA) 150 MG capsule Take 150 mg by mouth 2 (two) times daily.  [provider]    Family History No family history on file.  Social History Social History   Tobacco Use  . Smoking status: Former Smoker    Last attempt to quit: 11/20/2007    Years since quitting: 10.2  . Smokeless tobacco: Never Used  Substance Use Topics  . Alcohol use: No  . Drug use: No     Allergies   Penicillins and Sulfa antibiotics   Review of Systems Review of Systems 10 Systems reviewed and are negative for acute change except as noted in the HPI.   Physical Exam Updated Vital Signs BP (!) 167/82 (BP Location: Right Arm)   Pulse 98   Temp 98.4 F (36.9 C) (Oral)   Resp 20   Ht 5' 6"  (1.676 m)   SpO2 96%   BMI 32.77 kg/m   Physical Exam  Constitutional: She is oriented to person, place, and time. She appears well-developed and well-nourished.  HENT:  Head:  Normocephalic and atraumatic.  Mouth/Throat: Oropharynx is clear and moist.  Facial paralysis consistent with pre-existing chronic Bell's palsy  Eyes: Pupils are equal, round, and reactive to light. EOM are normal.  Neck: Neck supple.  Cardiovascular: Normal rate, regular rhythm, normal heart sounds and intact distal pulses.  Pulmonary/Chest: Effort normal and breath sounds normal.  Abdominal: Soft. Bowel sounds are normal. She exhibits no distension. There is no tenderness. There is no guarding.  Musculoskeletal: Normal range of motion. She exhibits no edema or tenderness.  Neurological: She is alert and oriented to person, place, and time. She has normal strength. She exhibits normal muscle tone. Coordination normal. GCS eye subscore is 4. GCS verbal subscore is 5. GCS motor subscore is 6.  Skin: Skin is warm, dry and intact.  Psychiatric: She has a normal mood and affect.     ED Treatments / Results  Labs (all labs ordered are listed, but only abnormal results are displayed) Labs Reviewed  COMPREHENSIVE METABOLIC PANEL - Abnormal; Notable for the following components:      Result Value   Glucose, Bld 135 (*)    Creatinine, Ser 1.05 (*)    GFR calc non Af Amer 55 (*)    All other components within normal limits  CBC WITH DIFFERENTIAL/PLATELET - Abnormal; Notable for the following components:   Hemoglobin 15.1 (*)    MCHC 36.3 (*)    All other components within normal limits  GASTROINTESTINAL PANEL BY PCR, STOOL (REPLACES STOOL CULTURE)  C DIFFICILE QUICK SCREEN W PCR REFLEX  LIPASE, BLOOD  URINALYSIS, ROUTINE W REFLEX MICROSCOPIC    EKG None  Radiology No results found.  Procedures Procedures (including critical care time)  Medications Ordered in ED Medications  sodium chloride 0.9 % bolus 1,000 mL (0 mLs Intravenous Stopped 03/06/18 2224)    Followed by  0.9 %  sodium chloride infusion (has no administration in time range)  ondansetron (ZOFRAN) injection 4 mg (4  mg Intravenous Given 03/06/18 2033)     Initial Impression / Assessment and Plan / ED Course  I have reviewed the triage vital signs and the nursing notes.  Pertinent labs & imaging results that were available during my care of the patient were reviewed by me and considered in my medical decision making (see chart for details).       Final Clinical Impressions(s) / ED Diagnoses   Final diagnoses:  Nausea vomiting and diarrhea   Patient felt much improved after hydration.  She denies any ongoing abdominal pain.  She has had no diarrhea or vomiting while in the emergency department.  She was not able to produce a urine specimen.  She reports she went into the bathroom and she "tinkled" but was not able to capture that specimen.  She is afebrile.  Abdominal exam is nonsurgical.  Diarrhea seems to have slowed down significantly compared to her prior description.  Patient does not have fever or leukocytosis.  At this time plan will be for symptomatic treatment with Zofran and Imodium.  He does not have apparent C. difficile risk factors.  Is counseled to follow-up with her PCP.  Return precautions reviewed. ED Discharge Orders        Ordered    ondansetron (ZOFRAN ODT) 4 MG disintegrating tablet  Every 4 hours PRN     03/06/18 2308    loperamide (IMODIUM) 2 MG capsule  4 times daily PRN     03/06/18 2308       Charlesetta Shanks, MD 03/06/18 2313

## 2018-03-06 NOTE — ED Triage Notes (Signed)
C/o n/v/d day 2-NAD-to triage in w/c

## 2018-03-06 NOTE — ED Notes (Signed)
Pt states she is feeling better. Denies any diarrhea episodes since being in ED, denies nausea, and denies acute pain. EDP notified. Gingerale given for fluid challenge.

## 2018-06-05 ENCOUNTER — Emergency Department (HOSPITAL_BASED_OUTPATIENT_CLINIC_OR_DEPARTMENT_OTHER)
Admission: EM | Admit: 2018-06-05 | Discharge: 2018-06-05 | Disposition: A | Discharge status: Home / Self Care | Payer: 59 | Attending: Emergency Medicine | Admitting: Emergency Medicine

## 2018-06-05 ENCOUNTER — Emergency Department (HOSPITAL_BASED_OUTPATIENT_CLINIC_OR_DEPARTMENT_OTHER): Payer: 59

## 2018-06-05 ENCOUNTER — Other Ambulatory Visit: Payer: Self-pay

## 2018-06-05 ENCOUNTER — Encounter (HOSPITAL_BASED_OUTPATIENT_CLINIC_OR_DEPARTMENT_OTHER): Payer: Self-pay | Admitting: *Deleted

## 2018-06-05 DIAGNOSIS — Z79899 Other long term (current) drug therapy: Secondary | ICD-10-CM | POA: Insufficient documentation

## 2018-06-05 DIAGNOSIS — R1084 Generalized abdominal pain: Secondary | ICD-10-CM | POA: Diagnosis present

## 2018-06-05 DIAGNOSIS — Z96611 Presence of right artificial shoulder joint: Secondary | ICD-10-CM | POA: Diagnosis not present

## 2018-06-05 DIAGNOSIS — K529 Noninfective gastroenteritis and colitis, unspecified: Secondary | ICD-10-CM | POA: Diagnosis not present

## 2018-06-05 DIAGNOSIS — Z87891 Personal history of nicotine dependence: Secondary | ICD-10-CM | POA: Insufficient documentation

## 2018-06-05 DIAGNOSIS — Z96653 Presence of artificial knee joint, bilateral: Secondary | ICD-10-CM | POA: Diagnosis not present

## 2018-06-05 LAB — CBC WITH DIFFERENTIAL/PLATELET
Basophils Absolute: 0.1 10*3/uL (ref 0.0–0.1)
Basophils Relative: 1 %
EOS PCT: 0 %
Eosinophils Absolute: 0 10*3/uL (ref 0.0–0.7)
HEMATOCRIT: 43.7 % (ref 36.0–46.0)
Hemoglobin: 15.8 g/dL — ABNORMAL HIGH (ref 12.0–15.0)
LYMPHS PCT: 18 %
Lymphs Abs: 1.2 10*3/uL (ref 0.7–4.0)
MCH: 32.8 pg (ref 26.0–34.0)
MCHC: 36.2 g/dL — AB (ref 30.0–36.0)
MCV: 90.9 fL (ref 78.0–100.0)
MONO ABS: 0.4 10*3/uL (ref 0.1–1.0)
MONOS PCT: 6 %
Neutro Abs: 5.2 10*3/uL (ref 1.7–7.7)
Neutrophils Relative %: 75 %
PLATELETS: 183 10*3/uL (ref 150–400)
RBC: 4.81 MIL/uL (ref 3.87–5.11)
RDW: 14.1 % (ref 11.5–15.5)
WBC: 6.9 10*3/uL (ref 4.0–10.5)

## 2018-06-05 LAB — COMPREHENSIVE METABOLIC PANEL
ALBUMIN: 4.6 g/dL (ref 3.5–5.0)
ALK PHOS: 75 U/L (ref 38–126)
ALT: 31 U/L (ref 0–44)
AST: 48 U/L — AB (ref 15–41)
Anion gap: 12 (ref 5–15)
BILIRUBIN TOTAL: 0.9 mg/dL (ref 0.3–1.2)
BUN: 15 mg/dL (ref 8–23)
CALCIUM: 10 mg/dL (ref 8.9–10.3)
CO2: 27 mmol/L (ref 22–32)
Chloride: 102 mmol/L (ref 98–111)
Creatinine, Ser: 1.04 mg/dL — ABNORMAL HIGH (ref 0.44–1.00)
GFR calc Af Amer: >60 mL/min (ref >60)
GFR calc non Af Amer: 56 mL/min — ABNORMAL LOW (ref >60)
GLUCOSE: 144 mg/dL — AB (ref 70–99)
POTASSIUM: 4 mmol/L (ref 3.5–5.1)
SODIUM: 141 mmol/L (ref 135–145)
Total Protein: 8.1 g/dL (ref 6.5–8.1)

## 2018-06-05 LAB — URINALYSIS, ROUTINE W REFLEX MICROSCOPIC
Bilirubin Urine: NEGATIVE
GLUCOSE, UA: NEGATIVE mg/dL
KETONES UR: 15 mg/dL — AB
Leukocytes, UA: NEGATIVE
NITRITE: NEGATIVE
PH: 7 (ref 5.0–8.0)
Protein, ur: 30 mg/dL — AB
SPECIFIC GRAVITY, URINE: 1.015 (ref 1.005–1.030)

## 2018-06-05 LAB — URINALYSIS, MICROSCOPIC (REFLEX)

## 2018-06-05 LAB — LIPASE, BLOOD: Lipase: 42 U/L (ref 11–51)

## 2018-06-05 MED ORDER — ONDANSETRON 8 MG PO TBDP: ORAL_TABLET | ORAL | 0 refills | Status: AC

## 2018-06-05 MED ORDER — SODIUM CHLORIDE 0.9 % IV BOLUS
500.0000 mL | Freq: Once | INTRAVENOUS | Status: AC
  Administered 2018-06-05: 1000 mL via INTRAVENOUS

## 2018-06-05 MED ORDER — MORPHINE SULFATE (PF) 4 MG/ML IV SOLN
4.0000 mg | Freq: Once | INTRAVENOUS | Status: AC
  Administered 2018-06-05: 4 mg via INTRAVENOUS
  Filled 2018-06-05: qty 1

## 2018-06-05 MED ORDER — IOPAMIDOL (ISOVUE-300) INJECTION 61%
100.0000 mL | Freq: Once | INTRAVENOUS | Status: AC
  Administered 2018-06-05: 100 mL via INTRAVENOUS

## 2018-06-05 MED ORDER — ONDANSETRON HCL 4 MG/2ML IJ SOLN
4.0000 mg | Freq: Once | INTRAMUSCULAR | Status: AC
  Administered 2018-06-05: 4 mg via INTRAVENOUS
  Filled 2018-06-05: qty 2

## 2018-06-05 MED ORDER — CIPROFLOXACIN HCL 500 MG PO TABS: 500.0000 mg | ORAL_TABLET | Freq: Two times a day (BID) | ORAL | 0 refills | Status: AC

## 2018-06-05 MED ORDER — METRONIDAZOLE 500 MG PO TABS: 500.0000 mg | ORAL_TABLET | Freq: Three times a day (TID) | ORAL | 0 refills | Status: AC

## 2018-06-05 NOTE — ED Provider Notes (Signed)
[facility] EMERGENCY DEPARTMENT Provider Note   CSN: [id number] Arrival date & time: 06/05/18  0014     History   Chief Complaint Chief Complaint  Patient presents with  . Emesis  . Diarrhea    HPI Jordan Bruce is a 67 y.o. female.  Patient is a 67 year old female with past medical history of multiple myeloma and chronic back pain-degenerative disc disease.  She presents today for evaluation of generalized abdominal pain, nausea, vomiting, and diarrhea that started this morning.  She reports severe abdominal cramping.  All vomit and diarrhea has been nonbloody.  She denies any fevers or chills.  She denies any ill contacts.  The history is provided by the patient.  Emesis   This is a new problem. Episode onset: This morning. The problem occurs continuously. The problem has not changed since onset.The emesis has an appearance of stomach contents. There has been no fever.    Past Medical History:  Diagnosis Date  . Bell's palsy   . Multiple myeloma (HCC)     There are no active problems to display for this patient.   Past Surgical History:  Procedure Laterality Date  . BUNIONECTOMY Bilateral   . CHOLECYSTECTOMY    . FOOT SURGERY    . REPLACEMENT TOTAL KNEE BILATERAL    . SHOULDER SURGERY Left   . TOTAL SHOULDER REPLACEMENT Right      OB History   None      Home Medications    Prior to Admission medications   Medication Sig Start Date End Date Taking? Authorizing Provider  artificial tears (LACRILUBE) OINT ophthalmic ointment Place into both eyes at bedtime as needed for dry eyes. 11/19/16   Tanna Furry, MD  cyclobenzaprine (FLEXERIL) 10 MG tablet Take 10 mg by mouth 3 (three) times daily as needed for muscle spasms.    [provider]  donepezil (ARICEPT) 10 MG tablet Take 10 mg by mouth at bedtime.    [provider]  DULoxetine (CYMBALTA) 30 MG capsule Take 30 mg by mouth daily.    [provider]  folic acid (FOLVITE) 1  MG tablet Take 1 mg by mouth daily.    [provider]  lidocaine (LIDODERM) 5 % Place 1 patch onto the skin daily. Remove & Discard patch within 12 hours or as directed by MD    [provider]  loperamide (IMODIUM) 2 MG capsule Take 1 capsule (2 mg total) by mouth 4 (four) times daily as needed for diarrhea or loose stools. 03/06/18   Charlesetta Shanks, MD  meloxicam (MOBIC) 7.5 MG tablet Take 7.5 mg by mouth daily.    [provider]  memantine (NAMENDA) 5 MG tablet Take 5 mg by mouth 2 (two) times daily.    [provider]  methylPREDNISolone (MEDROL DOSEPAK) 4 MG TBPK tablet 6 po on day 1, decrease by 1 tab per day 11/19/16   Tanna Furry, MD  morphine (MS CONTIN) 30 MG 12 hr tablet Take 30 mg by mouth every 12 (twelve) hours.    [provider]  ondansetron (ZOFRAN ODT) 4 MG disintegrating tablet Take 1 tablet (4 mg total) by mouth every 4 (four) hours as needed for nausea or vomiting. 03/06/18   Charlesetta Shanks, MD  oxyCODONE (ROXICODONE) 15 MG immediate release tablet Take 20 mg by mouth every 4 (four) hours as needed for pain.    [provider]  pregabalin (LYRICA) 150 MG capsule Take 150 mg by mouth 2 (two)  times daily.    [provider]    Family History No family history on file.  Social History Social History   Tobacco Use  . Smoking status: Former Smoker    Last attempt to quit: 11/20/2007    Years since quitting: 10.5  . Smokeless tobacco: Never Used  Substance Use Topics  . Alcohol use: No  . Drug use: No     Allergies   Penicillins and Sulfa antibiotics   Review of Systems Review of Systems  All other systems reviewed and are negative.    Physical Exam Updated Vital Signs BP (!) 165/100 (BP Location: Left Arm)   Pulse (!) 108   Temp 98.5 F (36.9 C) (Oral)   Resp 20   Ht 5' 6"  (1.676 m)   Wt 90.7 kg   SpO2 100%   BMI 32.28 kg/m   Physical Exam  Constitutional: She is oriented to person,  place, and time. She appears well-developed and well-nourished. No distress.  HENT:  Head: Normocephalic and atraumatic.  Neck: Normal range of motion. Neck supple.  Cardiovascular: Normal rate and regular rhythm. Exam reveals no gallop and no friction rub.  No murmur heard. Pulmonary/Chest: Effort normal and breath sounds normal. No respiratory distress. She has no wheezes.  Abdominal: Soft. Bowel sounds are normal. She exhibits no distension. There is tenderness. There is no rebound and no guarding.  There is tenderness to palpation in the left upper and left lower quadrant.  Musculoskeletal: Normal range of motion.  Neurological: She is alert and oriented to person, place, and time.  Skin: Skin is warm and dry. She is not diaphoretic.  Nursing note and vitals reviewed.    ED Treatments / Results  Labs (all labs ordered are listed, but only abnormal results are displayed) Labs Reviewed  COMPREHENSIVE METABOLIC PANEL  CBC WITH DIFFERENTIAL/PLATELET  LIPASE, BLOOD  URINALYSIS, ROUTINE W REFLEX MICROSCOPIC    EKG None  Radiology No results found.  Procedures Procedures (including critical care time)  Medications Ordered in ED Medications  sodium chloride 0.9 % bolus 500 mL (has no administration in time range)  ondansetron (ZOFRAN) injection 4 mg (has no administration in time range)  morphine 4 MG/ML injection 4 mg (has no administration in time range)     Initial Impression / Assessment and Plan / ED Course  I have reviewed the triage vital signs and the nursing notes.  Pertinent labs & imaging results that were available during my care of the patient were reviewed by me and considered in my medical decision making (see chart for details).  Patient presents with abdominal pain and vomiting, the etiology of which I am uncertain.  This may be an acute gastroenteritis, however there is evidence for possibly mild colitis.  I will prescribe Cipro and Flagyl for this as  well as nausea medicine which she can take at home.  She has chronic pain medications that she takes at home which she can resume.  Final Clinical Impressions(s) / ED Diagnoses   Final diagnoses:  None    ED Discharge Orders    None       Veryl Speak, MD 06/05/18 [phone]

## 2018-06-05 NOTE — ED Triage Notes (Addendum)
Pt reports nausea since this morning. She has been vomiting and having diarrhea also. Pt's husband states she has not been able to take her pain medication due to vomiting

## 2018-06-05 NOTE — ED Notes (Signed)
assisted to bedside commode. Tolerated well. Small amount of clear yellow urine noted.

## 2018-06-05 NOTE — ED Notes (Signed)
MD with pt  

## 2018-06-05 NOTE — Discharge Instructions (Addendum)
Zofran as prescribed as needed for nausea.  Resume your medications as previously prescribed.  Return to the emergency department if you develop worsening pain, high fever, bloody stool, or other new and concerning symptoms.

## 2022-05-17 ENCOUNTER — Other Ambulatory Visit: Payer: Self-pay

## 2022-05-17 ENCOUNTER — Emergency Department (HOSPITAL_BASED_OUTPATIENT_CLINIC_OR_DEPARTMENT_OTHER): Payer: Medicare Other

## 2022-05-17 ENCOUNTER — Emergency Department (HOSPITAL_BASED_OUTPATIENT_CLINIC_OR_DEPARTMENT_OTHER)
Admission: EM | Admit: 2022-05-17 | Discharge: 2022-05-17 | Disposition: A | Discharge status: Home / Self Care | Payer: Medicare Other | Attending: Emergency Medicine | Admitting: Emergency Medicine

## 2022-05-17 ENCOUNTER — Encounter (HOSPITAL_BASED_OUTPATIENT_CLINIC_OR_DEPARTMENT_OTHER): Payer: Self-pay | Admitting: Emergency Medicine

## 2022-05-17 DIAGNOSIS — J029 Acute pharyngitis, unspecified: Secondary | ICD-10-CM | POA: Diagnosis present

## 2022-05-17 DIAGNOSIS — Z96653 Presence of artificial knee joint, bilateral: Secondary | ICD-10-CM | POA: Diagnosis not present

## 2022-05-17 DIAGNOSIS — Z20822 Contact with and (suspected) exposure to covid-19: Secondary | ICD-10-CM | POA: Diagnosis not present

## 2022-05-17 DIAGNOSIS — G43909 Migraine, unspecified, not intractable, without status migrainosus: Secondary | ICD-10-CM | POA: Insufficient documentation

## 2022-05-17 DIAGNOSIS — J040 Acute laryngitis: Secondary | ICD-10-CM | POA: Insufficient documentation

## 2022-05-17 DIAGNOSIS — Z87891 Personal history of nicotine dependence: Secondary | ICD-10-CM | POA: Diagnosis not present

## 2022-05-17 DIAGNOSIS — Z96611 Presence of right artificial shoulder joint: Secondary | ICD-10-CM | POA: Insufficient documentation

## 2022-05-17 LAB — CBC WITH DIFFERENTIAL/PLATELET
Abs Immature Granulocytes: 0.03 10*3/uL (ref 0.00–0.07)
Basophils Absolute: 0.1 10*3/uL (ref 0.0–0.1)
Basophils Relative: 1 %
Eosinophils Absolute: 0.3 10*3/uL (ref 0.0–0.5)
Eosinophils Relative: 3 %
HCT: 38.9 % (ref 36.0–46.0)
Hemoglobin: 13.7 g/dL (ref 12.0–15.0)
Immature Granulocytes: 0 %
Lymphocytes Relative: 10 %
Lymphs Abs: 0.9 10*3/uL (ref 0.7–4.0)
MCH: 34.3 pg — ABNORMAL HIGH (ref 26.0–34.0)
MCHC: 35.2 g/dL (ref 30.0–36.0)
MCV: 97.3 fL (ref 80.0–100.0)
Monocytes Absolute: 0.8 10*3/uL (ref 0.1–1.0)
Monocytes Relative: 10 %
Neutro Abs: 6.2 10*3/uL (ref 1.7–7.7)
Neutrophils Relative %: 76 %
Platelets: 141 10*3/uL — ABNORMAL LOW (ref 150–400)
RBC: 4 MIL/uL (ref 3.87–5.11)
RDW: 14.7 % (ref 11.5–15.5)
WBC: 8.2 10*3/uL (ref 4.0–10.5)
nRBC: 0 % (ref 0.0–0.2)

## 2022-05-17 LAB — RESP PANEL BY RT-PCR (FLU A&B, COVID) ARPGX2
Influenza A by PCR: NEGATIVE
Influenza B by PCR: NEGATIVE
SARS Coronavirus 2 by RT PCR: NEGATIVE

## 2022-05-17 LAB — BASIC METABOLIC PANEL
Anion gap: 8 (ref 5–15)
BUN: 14 mg/dL (ref 8–23)
CO2: 26 mmol/L (ref 22–32)
Calcium: 9.4 mg/dL (ref 8.9–10.3)
Chloride: 106 mmol/L (ref 98–111)
Creatinine, Ser: 1.03 mg/dL — ABNORMAL HIGH (ref 0.44–1.00)
GFR, Estimated: 59 mL/min — ABNORMAL LOW (ref >60)
Glucose, Bld: 114 mg/dL — ABNORMAL HIGH (ref 70–99)
Potassium: 3.6 mmol/L (ref 3.5–5.1)
Sodium: 140 mmol/L (ref 135–145)

## 2022-05-17 MED ORDER — DEXAMETHASONE SODIUM PHOSPHATE 10 MG/ML IJ SOLN
10.0000 mg | Freq: Once | INTRAMUSCULAR | Status: AC
  Administered 2022-05-17: 10 mg via INTRAVENOUS
  Filled 2022-05-17: qty 1

## 2022-05-17 MED ORDER — SODIUM CHLORIDE 0.9 % IV BOLUS
1000.0000 mL | Freq: Once | INTRAVENOUS | Status: AC
  Administered 2022-05-17: 1000 mL via INTRAVENOUS

## 2022-05-17 MED ORDER — KETOROLAC TROMETHAMINE 15 MG/ML IJ SOLN
15.0000 mg | Freq: Once | INTRAMUSCULAR | Status: AC
  Administered 2022-05-17: 15 mg via INTRAVENOUS
  Filled 2022-05-17: qty 1

## 2022-05-17 MED ORDER — PROCHLORPERAZINE EDISYLATE 10 MG/2ML IJ SOLN
10.0000 mg | Freq: Once | INTRAMUSCULAR | Status: AC
Start: 2022-05-17 — End: 2022-05-17
  Administered 2022-05-17: 10 mg via INTRAVENOUS
  Filled 2022-05-17: qty 2

## 2022-05-17 MED ORDER — IOHEXOL 300 MG/ML  SOLN
100.0000 mL | Freq: Once | INTRAMUSCULAR | Status: AC | PRN
  Administered 2022-05-17: 75 mL via INTRAVENOUS

## 2022-05-17 MED ORDER — DIPHENHYDRAMINE HCL 50 MG/ML IJ SOLN
12.5000 mg | Freq: Once | INTRAMUSCULAR | Status: AC
  Administered 2022-05-17: 12.5 mg via INTRAVENOUS
  Filled 2022-05-17: qty 1

## 2022-05-17 NOTE — ED Triage Notes (Signed)
Patient presents to ED via POV from home. Here with 2 day history of sore throat, headache and inability to eat/drink. Muffled voice noted.

## 2022-05-17 NOTE — ED Provider Notes (Signed)
[facility] EMERGENCY DEPARTMENT Provider Note  CSN: [id number] Arrival date & time: 05/17/22 1142  Chief Complaint(s) Sore Throat  HPI Jordan Bruce is a 71 y.o. female with history of multiple myeloma presenting to the emergency department with sore throat.  Patient reports sore severe sore throat, trouble swallowing.  She reports last night she could not swallow.  She reports today it is improved.  No fevers or chills.  She reports headache, reports this is consistent with previous headaches, reports nausea without vomiting.  No dental pain.  No shortness of breath.   Past Medical History Past Medical History:  Diagnosis Date   Bell's palsy    Multiple myeloma (HCC)    There are no problems to display for this patient.  Home Medication(s) Prior to Admission medications   Medication Sig Start Date End Date Taking? Authorizing Provider  artificial tears (LACRILUBE) OINT ophthalmic ointment Place into both eyes at bedtime as needed for dry eyes. 11/19/16   Tanna Furry, MD  ciprofloxacin (CIPRO) 500 MG tablet Take 1 tablet (500 mg total) by mouth 2 (two) times daily. One po bid x 7 days 06/05/18   Veryl Speak, MD  cyclobenzaprine (FLEXERIL) 10 MG tablet Take 10 mg by mouth 3 (three) times daily as needed for muscle spasms.    [provider]  donepezil (ARICEPT) 10 MG tablet Take 10 mg by mouth at bedtime.    [provider]  DULoxetine (CYMBALTA) 30 MG capsule Take 30 mg by mouth daily.    [provider]  folic acid (FOLVITE) 1 MG tablet Take 1 mg by mouth daily.    [provider]  lidocaine (LIDODERM) 5 % Place 1 patch onto the skin daily. Remove & Discard patch within 12 hours or as directed by MD    [provider]  loperamide (IMODIUM) 2 MG capsule Take 1 capsule (2 mg total) by mouth 4 (four) times daily as needed for diarrhea or loose stools. 03/06/18   Charlesetta Shanks, MD  meloxicam (MOBIC) 7.5 MG tablet Take 7.5 mg by mouth  daily.    [provider]  memantine (NAMENDA) 5 MG tablet Take 5 mg by mouth 2 (two) times daily.    [provider]  methylPREDNISolone (MEDROL DOSEPAK) 4 MG TBPK tablet 6 po on day 1, decrease by 1 tab per day 11/19/16   Tanna Furry, MD  metroNIDAZOLE (FLAGYL) 500 MG tablet Take 1 tablet (500 mg total) by mouth 3 (three) times daily. One po tid x 7 days 06/05/18   Veryl Speak, MD  morphine (MS CONTIN) 30 MG 12 hr tablet Take 30 mg by mouth every 12 (twelve) hours.    [provider]  ondansetron (ZOFRAN ODT) 8 MG disintegrating tablet 27m ODT q4 hours prn nausea 06/05/18   DVeryl Speak MD  oxyCODONE (ROXICODONE) 15 MG immediate release tablet Take 20 mg by mouth every 4 (four) hours as needed for pain.    [provider]  pregabalin (LYRICA) 150 MG capsule Take 150 mg by mouth 2 (two) times daily.    [provider]  Past Surgical History Past Surgical History:  Procedure Laterality Date   BUNIONECTOMY Bilateral    CHOLECYSTECTOMY     FOOT SURGERY     REPLACEMENT TOTAL KNEE BILATERAL     SHOULDER SURGERY Left    TOTAL SHOULDER REPLACEMENT Right    Family History History reviewed. No pertinent family history.  Social History Social History   Tobacco Use   Smoking status: Former    Types: Cigarettes    Quit date: 11/20/2007    Years since quitting: 14.5   Smokeless tobacco: Never  Vaping Use   Vaping Use: Never used  Substance Use Topics   Alcohol use: No   Drug use: No   Allergies Penicillins and Sulfa antibiotics  Review of Systems Review of Systems  All other systems reviewed and are negative.   Physical Exam Vital Signs  I have reviewed the triage vital signs BP 130/66   Pulse 69   Temp 98.6 F (37 C)   Resp 17   SpO2 94%  Physical Exam Vitals and nursing note reviewed.  Constitutional:       General: She is not in acute distress.    Appearance: She is well-developed.  HENT:     Head: Normocephalic and atraumatic.     Comments: Oropharynx clear, no uvular deviation, no tonsillar exudate or enlargement, floor of mouth soft, soft voice    Mouth/Throat:     Mouth: Mucous membranes are moist.  Eyes:     Pupils: Pupils are equal, round, and reactive to light.  Cardiovascular:     Rate and Rhythm: Normal rate and regular rhythm.     Heart sounds: No murmur heard. Pulmonary:     Effort: Pulmonary effort is normal. No respiratory distress.     Breath sounds: Normal breath sounds.  Abdominal:     General: Abdomen is flat.     Palpations: Abdomen is soft.     Tenderness: There is no abdominal tenderness.  Musculoskeletal:        General: No tenderness.     Right lower leg: No edema.     Left lower leg: No edema.  Skin:    General: Skin is warm and dry.  Neurological:     General: No focal deficit present.     Mental Status: She is alert. Mental status is at baseline.     Comments: Cranial nerves II through XII intact other than chronic left sided facial droop sparing forehead, strength 5 out of 5 in the bilateral upper and lower extremities, no sensory deficit to light touch, no dysmetria on finger-nose-finger testing, ambulatory with steady gait.   Psychiatric:        Mood and Affect: Mood normal.        Behavior: Behavior normal.     ED Results and Treatments Labs (all labs ordered are listed, but only abnormal results are displayed) Labs Reviewed  BASIC METABOLIC PANEL - Abnormal; Notable for the following components:      Result Value   Glucose, Bld 114 (*)    Creatinine, Ser 1.03 (*)    GFR, Estimated 59 (*)    All other components within normal limits  CBC WITH DIFFERENTIAL/PLATELET - Abnormal; Notable for the following components:   MCH 34.3 (*)    Platelets 141 (*)    All other components within normal limits  RESP PANEL BY RT-PCR (FLU A&B, COVID)  ARPGX2  Radiology No results found.  Pertinent labs & imaging results that were available during my care of the patient were reviewed by me and considered in my medical decision making (see MDM for details).  Medications Ordered in ED Medications  ketorolac (TORADOL) 15 MG/ML injection 15 mg (15 mg Intravenous Given 05/17/22 1319)  prochlorperazine (COMPAZINE) injection 10 mg (10 mg Intravenous Given 05/17/22 1322)  sodium chloride 0.9 % bolus 1,000 mL (0 mLs Intravenous Stopped 05/17/22 1437)  diphenhydrAMINE (BENADRYL) injection 12.5 mg (12.5 mg Intravenous Given 05/17/22 1318)  dexamethasone (DECADRON) injection 10 mg (10 mg Intravenous Given 05/17/22 1320)  iohexol (OMNIPAQUE) 300 MG/ML solution 100 mL (75 mLs Intravenous Contrast Given 05/17/22 1423)                                                                                                                                     Procedures Procedures  (including critical care time)  Medical Decision Making / ED Course   MDM:  71 year old female presenting to the emergency department with sore throat.  Patient well-appearing, exam overall externally reassuring although patient has a very soft voice.  Will obtain CT soft tissue neck given her severe symptoms yesterday such as unable to swallow although currently patient is tolerating secretions.  Differential includes severe sore throat, epiglottitis, retropharyngeal abscess.  Doubt Ludwick's angina, peritonsillar abscess given reassuring physical exam.  Patient also reports headache which is similar to chronic migraines, given age will obtain CT head and treat with migraine cocktail.  Neurologic exam overall reassuring and demonstrated only of chronic left facial droop  Clinical Course as of 05/18/22 2003  Mon May 17, 2022  1530 CT scans negative. Symptoms have  significantly improve with migraine cocktail. Patient reports sore throat improved with steroids. Will discharge patient to home. All questions answered. Patient comfortable with plan of discharge. Return precautions discussed with patient and specified on the after visit summary.  [WS]  1537 CT Head Wo Contrast [WS]    Clinical Course User Index [WS] Cristie Hem, MD     Additional history obtained: -Additional history obtained from family -External records from outside source obtained and reviewed including: Chart review including previous notes, labs, imaging, consultation notes   Lab Tests: -I ordered, reviewed, and interpreted labs.   The pertinent results include:   Labs Reviewed  BASIC METABOLIC PANEL - Abnormal; Notable for the following components:      Result Value   Glucose, Bld 114 (*)    Creatinine, Ser 1.03 (*)    GFR, Estimated 59 (*)    All other components within normal limits  CBC WITH DIFFERENTIAL/PLATELET - Abnormal; Notable for the following components:   MCH 34.3 (*)    Platelets 141 (*)    All other components within normal limits  RESP PANEL BY RT-PCR (FLU A&B, COVID) ARPGX2        Imaging Studies ordered: I ordered imaging studies  including CT head, CT neck On my interpretation imaging demonstrates no acute process I independently visualized and interpreted imaging. I agree with the radiologist interpretation   Medicines ordered and prescription drug management: Meds ordered this encounter  Medications   ketorolac (TORADOL) 15 MG/ML injection 15 mg   prochlorperazine (COMPAZINE) injection 10 mg   sodium chloride 0.9 % bolus 1,000 mL   diphenhydrAMINE (BENADRYL) injection 12.5 mg   dexamethasone (DECADRON) injection 10 mg   iohexol (OMNIPAQUE) 300 MG/ML solution 100 mL    -I have reviewed the patients home medicines and have made adjustments as needed  Social Determinants of Health:  Factors impacting patients care include: former  smoker   Reevaluation: After the interventions noted above, I reevaluated the patient and found that they have improved  Co morbidities that complicate the patient evaluation  Past Medical History:  Diagnosis Date   Bell's palsy    Multiple myeloma ([place])       Dispostion: Discharge    Final Clinical Impression(s) / ED Diagnoses Final diagnoses:  Migraine without status migrainosus, not intractable, unspecified migraine type  Laryngitis     This chart was dictated using voice recognition software.  Despite best efforts to proofread,  errors can occur which can change the documentation meaning.    Cristie Hem, MD 05/18/22 2003

## 2022-05-17 NOTE — Discharge Instructions (Addendum)
We evaluated you in the emergency department for your sore throat and migraine.  We obtained a CT scan of your head and a CT scan of your neck.  The CT scan of your head and neck did not show sign of acute problem such as a severe bacterial infection of your throat.  The CT scan of your head did not show any evidence of an intracranial bleed or tumor.  Your symptoms improved in the emergency department with steroids and migraine treatment.  Please follow-up with your primary doctor with in the next few days.  Please return to the emergency department as needed for worsening symptoms, difficulty breathing, trouble swallowing, vomiting, or any other concerning symptoms.
